# Patient Record
Sex: Male | Born: 1985 | Race: Black or African American | Hispanic: No | Marital: Married | Smoking: Current every day smoker
Health system: Southern US, Community
[De-identification: ages and names within clinical notes are randomized; demographics above are authoritative.]

## PROBLEM LIST (undated history)

## (undated) ENCOUNTER — Ambulatory Visit: Source: Home / Self Care

## (undated) DIAGNOSIS — J45909 Unspecified asthma, uncomplicated: Secondary | ICD-10-CM

## (undated) DIAGNOSIS — K649 Unspecified hemorrhoids: Secondary | ICD-10-CM

## (undated) DIAGNOSIS — K625 Hemorrhage of anus and rectum: Secondary | ICD-10-CM

## (undated) HISTORY — DX: Hemorrhage of anus and rectum: K62.5

## (undated) HISTORY — DX: Unspecified hemorrhoids: K64.9

---

## 2010-10-28 ENCOUNTER — Inpatient Hospital Stay (INDEPENDENT_AMBULATORY_CARE_PROVIDER_SITE_OTHER)
Admission: RE | Admit: 2010-10-28 | Discharge: 2010-10-28 | Disposition: A | Discharge status: Home / Self Care | Payer: Self-pay | Source: Ambulatory Visit | Attending: Emergency Medicine | Admitting: Emergency Medicine

## 2010-10-28 DIAGNOSIS — N342 Other urethritis: Secondary | ICD-10-CM

## 2010-10-30 LAB — GC/CHLAMYDIA PROBE AMP, GENITAL: Chlamydia, DNA Probe: POSITIVE — AB

## 2011-10-01 ENCOUNTER — Ambulatory Visit: Payer: Self-pay | Admitting: Family Medicine

## 2011-10-01 DIAGNOSIS — S61209A Unspecified open wound of unspecified finger without damage to nail, initial encounter: Secondary | ICD-10-CM

## 2011-10-01 DIAGNOSIS — Z23 Encounter for immunization: Secondary | ICD-10-CM

## 2011-10-01 DIAGNOSIS — S6000XA Contusion of unspecified finger without damage to nail, initial encounter: Secondary | ICD-10-CM

## 2011-10-01 DIAGNOSIS — IMO0001 Reserved for inherently not codable concepts without codable children: Secondary | ICD-10-CM

## 2011-10-01 MED ORDER — TETANUS-DIPHTH-ACELL PERTUSSIS 5-2.5-18.5 LF-MCG/0.5 IM SUSP
0.5000 mL | Freq: Once | INTRAMUSCULAR | Status: AC
  Administered 2011-10-01: 0.5 mL via INTRAMUSCULAR

## 2011-10-01 NOTE — Patient Instructions (Addendum)

## 2011-10-01 NOTE — Progress Notes (Signed)
Subjective: 26 year old [place] male who cut his right index finger along more this morning. Apparently somehow the blade flex his finger tip. He got a laceration going through the age of the nail and head of the finger. There is pain. He also has a small subungual hematoma developing under the middle finger nail  He is uncertain when he had his last tetanus shot. They're trying to check on that. He has been in the [place] 2 years. He is a Consulting civil engineer at [employer]. He is to take some exams next week and concerned that he will be able to keyboard without finger.  Although this occurred at work apparently it has not been put through his workers compensation.  Objective: Wound of finger tip as described above. The medial aspect of the nail is slice through longitudinally.   There is a mild subungual hematoma bruising developing under the middle finger nail. Her vascular is intact.  Assessment: Wound right index fingertip Subungual hematoma middle finger  Plan: Anesthetize and repair Checking on need for tetanus shot  It was decided that he did need the tetanus, so he was given a to get booster. Wound was repaired by Francia Greaves PA, and he will return in 7-10 days.

## 2011-10-01 NOTE — Progress Notes (Signed)
Verbal consent obtained from the patient.  Local anesthesia with 4cc Lidocaine 2% without epinephrine.  Wound scrubbed with soap and water and rinsed.  Wound closed with 4 4-0 Prolene simple interrupted sutures.  Wound cleansed and dressed.

## 2011-10-10 ENCOUNTER — Ambulatory Visit (INDEPENDENT_AMBULATORY_CARE_PROVIDER_SITE_OTHER): Payer: Self-pay | Admitting: Family Medicine

## 2011-10-10 ENCOUNTER — Ambulatory Visit: Payer: Self-pay

## 2011-10-10 VITALS — BP 128/76 | HR 64 | Temp 98.6°F | Resp 14 | Ht 70.0 in | Wt 201.0 lb

## 2011-10-10 DIAGNOSIS — S61219A Laceration without foreign body of unspecified finger without damage to nail, initial encounter: Secondary | ICD-10-CM

## 2011-10-10 DIAGNOSIS — S62609A Fracture of unspecified phalanx of unspecified finger, initial encounter for closed fracture: Secondary | ICD-10-CM

## 2011-10-10 DIAGNOSIS — S61209A Unspecified open wound of unspecified finger without damage to nail, initial encounter: Secondary | ICD-10-CM

## 2011-10-10 NOTE — Progress Notes (Signed)
26 year old [place] male who cut his right index finger 1 week ago. Apparently somehow the blade flex his finger tip. He got a laceration going through the age of the nail and head of the finger. There is minimal pain now. 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 Although this occurred at work apparently it has not been put through his workers compensation.  O:  The index finger, distal phalanx, is still quite swollen.  The wound edges are well approximated. The swelling gives the appearance of incomplete extension. There is only mild tenderness with palpation of the distal phalanx [facility] reading (PRIMARY) by  Dr. Milus Glazier:  Right distal phalanx of index finger fracture with STS  Assessment: No sign of infection, good wound healing, distal phalanx finger fracture  Plan: Remove sutures today, apply splint and maintained in a splinted position until recheck in one month  Return if increased swelling or pain occurs.

## 2017-08-08 ENCOUNTER — Emergency Department (HOSPITAL_COMMUNITY)
Admission: EM | Admit: 2017-08-08 | Discharge: 2017-08-08 | Disposition: A | Discharge status: Home / Self Care | Payer: Self-pay | Attending: Emergency Medicine | Admitting: Emergency Medicine

## 2017-08-08 ENCOUNTER — Emergency Department (HOSPITAL_COMMUNITY): Payer: Self-pay

## 2017-08-08 ENCOUNTER — Other Ambulatory Visit: Payer: Self-pay

## 2017-08-08 ENCOUNTER — Encounter (HOSPITAL_COMMUNITY): Payer: Self-pay

## 2017-08-08 DIAGNOSIS — F17210 Nicotine dependence, cigarettes, uncomplicated: Secondary | ICD-10-CM | POA: Insufficient documentation

## 2017-08-08 DIAGNOSIS — J069 Acute upper respiratory infection, unspecified: Secondary | ICD-10-CM | POA: Insufficient documentation

## 2017-08-08 DIAGNOSIS — J45901 Unspecified asthma with (acute) exacerbation: Secondary | ICD-10-CM | POA: Insufficient documentation

## 2017-08-08 HISTORY — DX: Unspecified asthma, uncomplicated: J45.909

## 2017-08-08 MED ORDER — BENZONATATE 100 MG PO CAPS: 100.0000 mg | ORAL_CAPSULE | Freq: Three times a day (TID) | ORAL | 0 refills | Status: DC

## 2017-08-08 MED ORDER — ALBUTEROL SULFATE (2.5 MG/3ML) 0.083% IN NEBU
5.0000 mg | INHALATION_SOLUTION | Freq: Once | RESPIRATORY_TRACT | Status: AC
  Administered 2017-08-08: 5 mg via RESPIRATORY_TRACT
  Filled 2017-08-08: qty 6

## 2017-08-08 MED ORDER — ALBUTEROL SULFATE HFA 108 (90 BASE) MCG/ACT IN AERS
1.0000 | INHALATION_SPRAY | Freq: Once | RESPIRATORY_TRACT | Status: AC
  Administered 2017-08-08: 2 via RESPIRATORY_TRACT
  Filled 2017-08-08: qty 6.7

## 2017-08-08 MED ORDER — ALBUTEROL SULFATE HFA 108 (90 BASE) MCG/ACT IN AERS: 1.0000 | INHALATION_SPRAY | Freq: Four times a day (QID) | RESPIRATORY_TRACT | 0 refills | Status: DC | PRN

## 2017-08-08 NOTE — ED Triage Notes (Addendum)
Pt reports allergies and chest pain with coughing. Pt states hx of asthma. No distress noted in triage. Skin warm and dry. Pt states the chest pain is only present when he coughs.

## 2017-08-08 NOTE — Discharge Instructions (Addendum)
Please read attached information. If you experience any new or worsening signs or symptoms please return to the emergency room for evaluation. Please follow-up with your primary care provider or specialist as discussed. Please use medication prescribed only as directed and discontinue taking if you have any concerning signs or symptoms.   °

## 2017-08-08 NOTE — ED Provider Notes (Signed)
[facility] EMERGENCY DEPARTMENT Provider Note   CSN: [id number] Arrival date & time: 08/08/17  1345     History   Chief Complaint Chief Complaint  Patient presents with  . Cough    HPI Stuart Green is a 32 y.o. male.  HPI   32 year old male presents today with complaints of asthma exacerbation.  Patient reports that last night he developed sneezing, cough, wheezing, rhinorrhea.  Patient notes a history of asthma but does not have inhalers at home.  Patient denies any fever, reports chest pain with coughing, none at rest.  Patient received breathing treatment prior to my evaluation which significantly improved his symptoms.  No shortness of breath at the time of evaluation.  Past Medical History:  Diagnosis Date  . Asthma     There are no active problems to display for this patient.   History reviewed. No pertinent surgical history.      Home Medications    Prior to Admission medications   Medication Sig Start Date End Date Taking? Authorizing Provider  albuterol (PROVENTIL HFA;VENTOLIN HFA) 108 (90 Base) MCG/ACT inhaler Inhale 1-2 puffs into the lungs every 6 (six) hours as needed for wheezing or shortness of breath. 08/08/17   Dawit Tankard, Tinnie Gens, PA-C  benzonatate (TESSALON) 100 MG capsule Take 1 capsule (100 mg total) by mouth every 8 (eight) hours. 08/08/17   Eyvonne Mechanic, PA-C    Family History History reviewed. No pertinent family history.  Social History Social History   Tobacco Use  . Smoking status: Current Every Day Smoker    Packs/day: 0.40    Types: Cigarettes  . Smokeless tobacco: Never Used  Substance Use Topics  . Alcohol use: Yes  . Drug use: Not on file     Allergies   Patient has no known allergies.   Review of Systems Review of Systems  All other systems reviewed and are negative.  Physical Exam Updated Vital Signs BP (!) 153/99 (BP Location: Right Arm)   Pulse 97   Temp 99 F (37.2 C) (Oral)   Resp 16    SpO2 99%   Physical Exam  Constitutional: He is oriented to person, place, and time. He appears well-developed and well-nourished.  HENT:  Head: Normocephalic and atraumatic.  Eyes: Pupils are equal, round, and reactive to light. Conjunctivae are normal. Right eye exhibits no discharge. Left eye exhibits no discharge. No scleral icterus.  Neck: Normal range of motion. No JVD present. No tracheal deviation present.  Cardiovascular: Normal rate, regular rhythm, normal heart sounds and intact distal pulses.  Pulmonary/Chest: Effort normal. No stridor. No respiratory distress. He has wheezes. He has no rales. He exhibits no tenderness.  Faint bilateral expiratory wheeze, no crackles no respiratory distress  Neurological: He is alert and oriented to person, place, and time. Coordination normal.  Psychiatric: He has a normal mood and affect. His behavior is normal. Judgment and thought content normal.  Nursing note and vitals reviewed.   ED Treatments / Results  Labs (all labs ordered are listed, but only abnormal results are displayed) Labs Reviewed - No data to display  EKG None  Radiology Dg Chest 2 View  Result Date: 08/08/2017 CLINICAL DATA:  Shortness of breath EXAM: CHEST - 2 VIEW COMPARISON:  None. FINDINGS: Heart and mediastinal contours are within normal limits. No focal opacities or effusions. No acute bony abnormality. IMPRESSION: No active cardiopulmonary disease. Electronically Signed   By: Charlett Nose M.D.   On: 08/08/2017 14:43  Procedures Procedures (including critical care time)  Medications Ordered in ED Medications  albuterol (PROVENTIL HFA;VENTOLIN HFA) 108 (90 Base) MCG/ACT inhaler 1-2 puff (has no administration in time range)  albuterol (PROVENTIL) (2.5 MG/3ML) 0.083% nebulizer solution 5 mg (5 mg Nebulization Given 08/08/17 1407)     Initial Impression / Assessment and Plan / ED Course  I have reviewed the triage vital signs and the nursing  notes.  Pertinent labs & imaging results that were available during my care of the patient were reviewed by me and considered in my medical decision making (see chart for details).       Final Clinical Impressions(s) / ED Diagnoses   Final diagnoses:  Viral upper respiratory tract infection  Mild asthma with exacerbation, unspecified whether persistent   Labs:   Imaging: Chest 2 view  Consults:  Therapeutics: Albuterol  Discharge Meds:   Assessment/Plan: Patient's presentation was consistent with viral URI with asthma exacerbation.  He is well-appearing in no acute distress at time of evaluation.  Patient has negative chest x-ray very minor wheeze.  He will be discharged home with albuterol, cough medication, strict return precautions.  Patient verbalized understanding and agreement to today's plan had no further questions or concerns at the time of discharge.   ED Discharge Orders        Ordered    albuterol (PROVENTIL HFA;VENTOLIN HFA) 108 (90 Base) MCG/ACT inhaler  Every 6 hours PRN     08/08/17 1708    benzonatate (TESSALON) 100 MG capsule  Every 8 hours     08/08/17 1708       Eyvonne Mechanic, PA-C 08/08/17 1709    Eber Hong, MD 08/12/17 [phone]

## 2019-04-03 IMAGING — DX DG CHEST 2V
2 series · 2 of 2 positions shown · non-contrast
Comparison: None.

CLINICAL DATA: Shortness of breath

EXAM:
CHEST - 2 VIEW

[w chest pa]
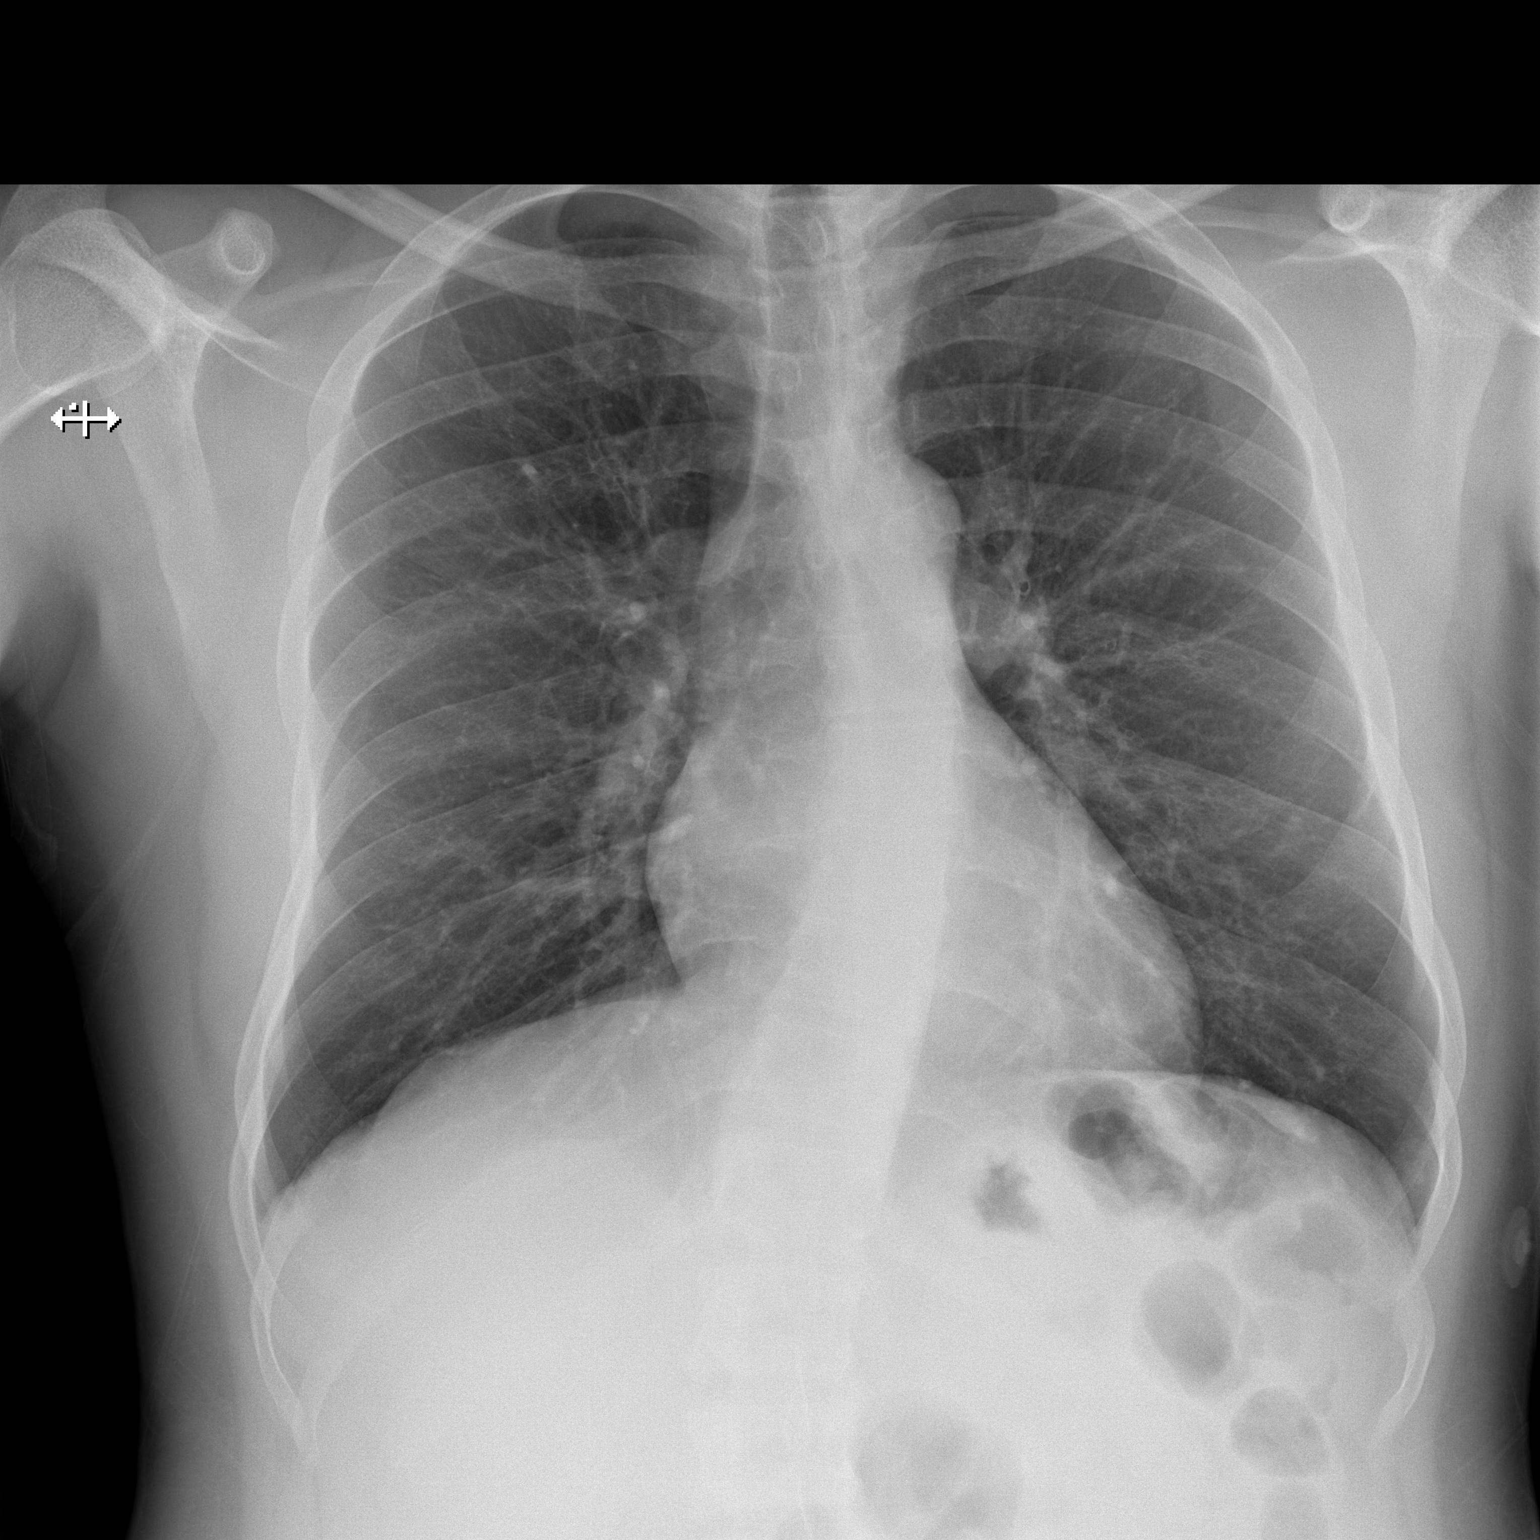

[w chest lat]
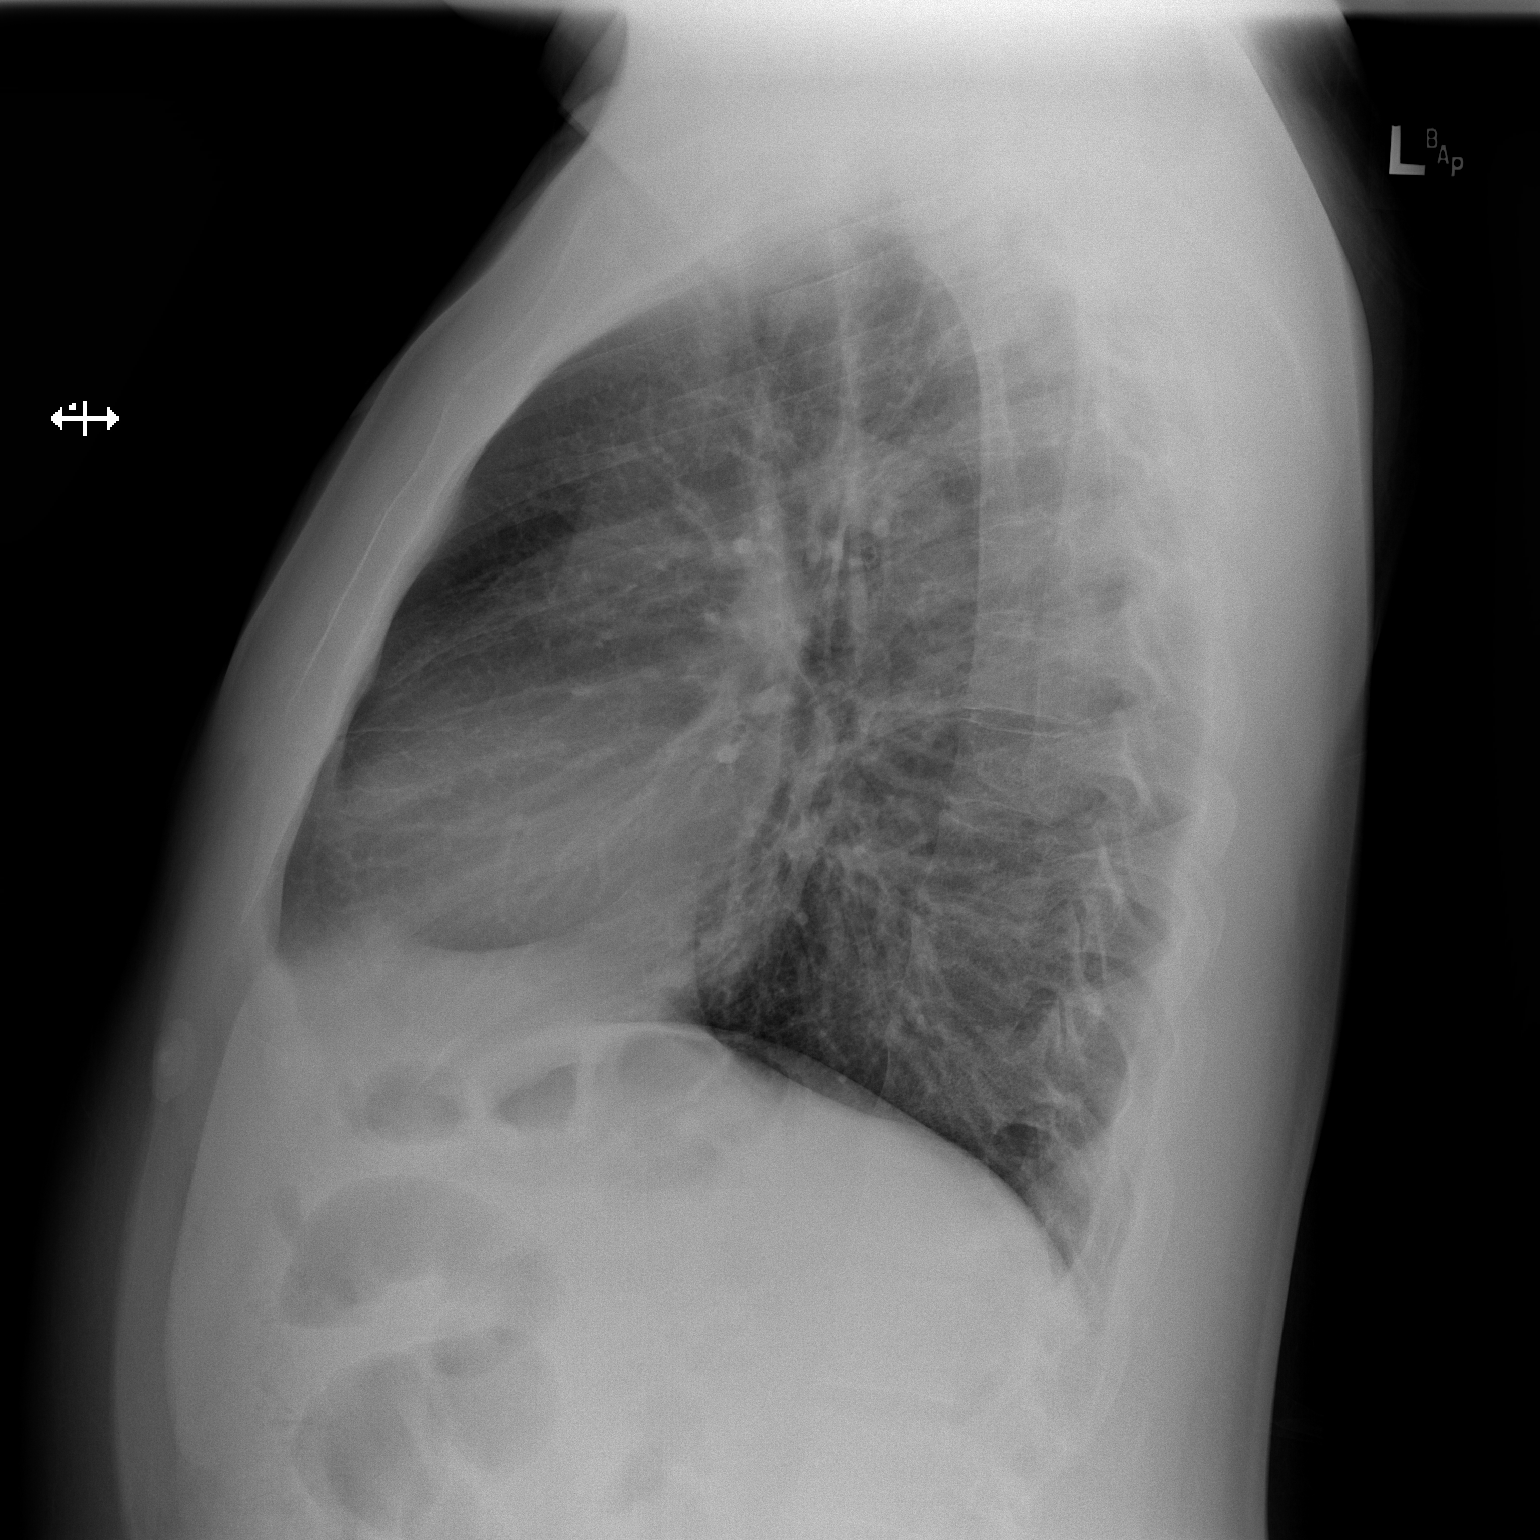

[2 of 2 positions shown; findings below may reference images not displayed]

FINDINGS: Heart and mediastinal contours are within normal limits. No focal
opacities or effusions. No acute bony abnormality.
IMPRESSION: No active cardiopulmonary disease.

## 2019-09-14 ENCOUNTER — Encounter (HOSPITAL_COMMUNITY): Payer: Self-pay | Admitting: Emergency Medicine

## 2019-09-14 ENCOUNTER — Emergency Department (HOSPITAL_COMMUNITY)
Admission: EM | Admit: 2019-09-14 | Discharge: 2019-09-14 | Disposition: A | Discharge status: Home / Self Care | Payer: 59 | Attending: Emergency Medicine | Admitting: Emergency Medicine

## 2019-09-14 ENCOUNTER — Other Ambulatory Visit: Payer: Self-pay

## 2019-09-14 DIAGNOSIS — J45909 Unspecified asthma, uncomplicated: Secondary | ICD-10-CM | POA: Insufficient documentation

## 2019-09-14 DIAGNOSIS — F17210 Nicotine dependence, cigarettes, uncomplicated: Secondary | ICD-10-CM | POA: Diagnosis not present

## 2019-09-14 DIAGNOSIS — K649 Unspecified hemorrhoids: Secondary | ICD-10-CM | POA: Diagnosis not present

## 2019-09-14 DIAGNOSIS — K625 Hemorrhage of anus and rectum: Secondary | ICD-10-CM | POA: Diagnosis not present

## 2019-09-14 LAB — COMPREHENSIVE METABOLIC PANEL
ALT: 49 U/L — ABNORMAL HIGH (ref 0–44)
AST: 32 U/L (ref 15–41)
Albumin: 4 g/dL (ref 3.5–5.0)
Alkaline Phosphatase: 58 U/L (ref 38–126)
Anion gap: 10 (ref 5–15)
BUN: 7 mg/dL (ref 6–20)
CO2: 23 mmol/L (ref 22–32)
Calcium: 9.1 mg/dL (ref 8.9–10.3)
Chloride: 103 mmol/L (ref 98–111)
Creatinine, Ser: 0.83 mg/dL (ref 0.61–1.24)
GFR calc Af Amer: >60 mL/min (ref >60)
GFR calc non Af Amer: >60 mL/min (ref >60)
Glucose, Bld: 101 mg/dL — ABNORMAL HIGH (ref 70–99)
Potassium: 4 mmol/L (ref 3.5–5.1)
Sodium: 136 mmol/L (ref 135–145)
Total Bilirubin: 0.7 mg/dL (ref 0.3–1.2)
Total Protein: 7.2 g/dL (ref 6.5–8.1)

## 2019-09-14 LAB — CBC
HCT: 45.6 % (ref 39.0–52.0)
Hemoglobin: 14.8 g/dL (ref 13.0–17.0)
MCH: 28.1 pg (ref 26.0–34.0)
MCHC: 32.5 g/dL (ref 30.0–36.0)
MCV: 86.7 fL (ref 80.0–100.0)
Platelets: 211 10*3/uL (ref 150–400)
RBC: 5.26 MIL/uL (ref 4.22–5.81)
RDW: 14.1 % (ref 11.5–15.5)
WBC: 4.7 10*3/uL (ref 4.0–10.5)
nRBC: 0 % (ref 0.0–0.2)

## 2019-09-14 LAB — POC OCCULT BLOOD, ED: Fecal Occult Bld: NEGATIVE

## 2019-09-14 LAB — TYPE AND SCREEN
ABO/RH(D): O POS
Antibody Screen: NEGATIVE

## 2019-09-14 LAB — ABO/RH: ABO/RH(D): O POS

## 2019-09-14 NOTE — ED Provider Notes (Signed)
[facility] EMERGENCY DEPARTMENT Provider Note   CSN: [id number] Arrival date & time: 09/14/19  1008     History Chief Complaint  Patient presents with   Rectal Bleeding    Stuart Green is a 34 y.o. male.  HPI Patient presents with blood in stool.  His common thought of the last 2 to 3 months.  States it will be a little bit of blood mixed in with the stool some blood when he wipes.  No pain.  States he has had a hemorrhoid however for 4 to 5 months.  No unexplained weight loss.  No weight gain.  No change in the color of the stool.  States he has had issues mildly with diarrhea for more of his life but really not had much bleeding in the past.  No trauma.  Has not seen a gastroenterologist.    Past Medical History:  Diagnosis Date   Asthma     There are no problems to display for this patient.   History reviewed. No pertinent surgical history.     No family history on file.  Social History   Tobacco Use   Smoking status: Current Every Day Smoker    Packs/day: 0.40    Types: Cigarettes   Smokeless tobacco: Never Used  Substance Use Topics   Alcohol use: Yes   Drug use: Not Currently    Home Medications Prior to Admission medications   Medication Sig Start Date End Date Taking? Authorizing Provider  albuterol (PROVENTIL HFA;VENTOLIN HFA) 108 (90 Base) MCG/ACT inhaler Inhale 1-2 puffs into the lungs every 6 (six) hours as needed for wheezing or shortness of breath. 08/08/17   Hedges, Dellis Filbert, PA-C  benzonatate (TESSALON) 100 MG capsule Take 1 capsule (100 mg total) by mouth every 8 (eight) hours. 08/08/17   Okey Regal, PA-C    Allergies    Patient has no known allergies.  Review of Systems   Review of Systems  Constitutional: Negative for appetite change.  HENT: Negative for congestion.   Respiratory: Negative for shortness of breath.   Cardiovascular: Negative for chest pain.  Gastrointestinal: Positive for anal bleeding and  blood in stool. Negative for abdominal pain, diarrhea, nausea and rectal pain.  Genitourinary: Negative for flank pain.  Musculoskeletal: Negative for back pain.  Skin: Negative for rash.  Neurological: Negative for weakness.    Physical Exam Updated Vital Signs BP (!) 156/108 (BP Location: Right Arm)    Pulse 72    Temp 99.1 F (37.3 C) (Oral)    Resp 18    SpO2 100%   Physical Exam Vitals and nursing note reviewed.  HENT:     Head: Atraumatic.  Eyes:     Pupils: Pupils are equal, round, and reactive to light.  Cardiovascular:     Rate and Rhythm: Regular rhythm.  Pulmonary:     Breath sounds: No wheezing or rhonchi.  Abdominal:     Tenderness: There is no abdominal tenderness.  Genitourinary:    Comments: Guaiac negative.  Does have external hemorrhoid but no clot or active bleeding seen.  Minimal amount of stool on exam but did appear to be brown.  No rectal tenderness. Musculoskeletal:     Cervical back: Neck supple.  Skin:    General: Skin is warm.     Capillary Refill: Capillary refill takes less than 2 seconds.  Neurological:     Mental Status: He is alert and oriented to person, place, and time.  ED Results / Procedures / Treatments   Labs (all labs ordered are listed, but only abnormal results are displayed) Labs Reviewed  COMPREHENSIVE METABOLIC PANEL - Abnormal; Notable for the following components:      Result Value   Glucose, Bld 101 (*)    ALT 49 (*)    All other components within normal limits  CBC  POC OCCULT BLOOD, ED  TYPE AND SCREEN  ABO/RH    EKG None  Radiology No results found.  Procedures Procedures (including critical care time)  Medications Ordered in ED Medications - No data to display  ED Course  I have reviewed the triage vital signs and the nursing notes.  Pertinent labs & imaging results that were available during my care of the patient were reviewed by me and considered in my medical decision making (see chart for  details).    MDM Rules/Calculators/A&P                          Patient with rectal bleeding with benign exam and benign blood work.  Does have hemorrhoids without active bleeding seen.  Will have follow-up with gastroenterology.  Also some hypertension here.  Unknown baseline but will follow up as an outpatient also for this. Final Clinical Impression(s) / ED Diagnoses Final diagnoses:  Rectal bleeding  Hemorrhoids, unspecified hemorrhoid type    Rx / DC Orders ED Discharge Orders    None       Benjiman Core, MD 09/14/19 1220

## 2019-09-14 NOTE — ED Triage Notes (Signed)
Reports intermittent blood in stool for a few months.  States the last episode was 2 days ago. Denies abd pain.

## 2021-07-14 DIAGNOSIS — K645 Perianal venous thrombosis: Secondary | ICD-10-CM | POA: Insufficient documentation

## 2022-02-17 ENCOUNTER — Ambulatory Visit: Payer: 59 | Admitting: Family Medicine

## 2022-04-07 ENCOUNTER — Ambulatory Visit
Admission: EM | Admit: 2022-04-07 | Discharge: 2022-04-07 | Disposition: A | Discharge status: Home / Self Care | Payer: 59 | Attending: Internal Medicine | Admitting: Internal Medicine

## 2022-04-07 DIAGNOSIS — R21 Rash and other nonspecific skin eruption: Secondary | ICD-10-CM

## 2022-04-07 DIAGNOSIS — T7840XA Allergy, unspecified, initial encounter: Secondary | ICD-10-CM

## 2022-04-07 DIAGNOSIS — R030 Elevated blood-pressure reading, without diagnosis of hypertension: Secondary | ICD-10-CM | POA: Diagnosis not present

## 2022-04-07 MED ORDER — PREDNISONE 20 MG PO TABS: 40.0000 mg | ORAL_TABLET | Freq: Every day | ORAL | 0 refills | Status: AC

## 2022-04-07 NOTE — Discharge Instructions (Signed)
I have prescribed prednisone steroid to help alleviate rash to your face.  Please avoid barbershop if possible.  I recommend that you get a blood pressure cuff from the pharmacy as well to monitor your blood pressure at home.  If blood pressure is consistently above 140, please see family doctor.

## 2022-04-07 NOTE — ED Provider Notes (Signed)
[facility]    CSN: [id number] Arrival date & time: 04/07/22  1125      History   Chief Complaint Chief Complaint  Patient presents with   Allergic Reaction    HPI Stuart Green is a 37 y.o. male.   Patient presents with itchy rash throughout face that has been present for approximately 1 week.  Patient reports that this happens every time he goes to the barbershop.  He has attempted to try a different barbershop but it still occurs.  Denies any other changes to the environment.  Denies any associated fever.  Patient has not used any medications to help alleviate symptoms.  Denies feelings of throat closing or shortness of breath.  Patient also has mildly elevated blood pressure reading but denies history of hypertension and does not take any daily medications.  Patient does not report chest pain, shortness of breath, headache, dizziness, blurred vision, nausea, vomiting.   Allergic Reaction   Past Medical History:  Diagnosis Date   Asthma     There are no problems to display for this patient.   History reviewed. No pertinent surgical history.     Home Medications    Prior to Admission medications   Medication Sig Start Date End Date Taking? Authorizing Provider  predniSONE (DELTASONE) 20 MG tablet Take 2 tablets (40 mg total) by mouth daily for 5 days. 04/07/22 04/12/22 Yes Rebecka Oelkers, Michele Rockers, FNP  albuterol (PROVENTIL HFA;VENTOLIN HFA) 108 (90 Base) MCG/ACT inhaler Inhale 1-2 puffs into the lungs every 6 (six) hours as needed for wheezing or shortness of breath. 08/08/17   Hedges, Dellis Filbert, PA-C  benzonatate (TESSALON) 100 MG capsule Take 1 capsule (100 mg total) by mouth every 8 (eight) hours. 08/08/17   Okey Regal, PA-C    Family History History reviewed. No pertinent family history.  Social History Social History   Tobacco Use   Smoking status: Every Day    Packs/day: 0.40    Types: Cigarettes   Smokeless tobacco: Never  Substance Use Topics    Alcohol use: Yes   Drug use: Not Currently     Allergies   Patient has no known allergies.   Review of Systems Review of Systems Per HPI  Physical Exam Triage Vital Signs ED Triage Vitals  Enc Vitals Group     BP 04/07/22 1139 (!) 140/92     Pulse Rate 04/07/22 1139 89     Resp 04/07/22 1139 12     Temp 04/07/22 1139 98.2 F (36.8 C)     Temp Source 04/07/22 1139 Oral     SpO2 04/07/22 1139 98 %     Weight --      Height --      Head Circumference --      Peak Flow --      Pain Score 04/07/22 1138 0     Pain Loc --      Pain Edu? --      Excl. in [place]? --    No data found.  Updated Vital Signs BP (!) 140/92 (BP Location: Left Arm)   Pulse 89   Temp 98.2 F (36.8 C) (Oral)   Resp 12   SpO2 98%   Visual Acuity Right Eye Distance:   Left Eye Distance:   Bilateral Distance:    Right Eye Near:   Left Eye Near:    Bilateral Near:     Physical Exam Constitutional:      General: He is not in acute  distress.    Appearance: Normal appearance. He is not toxic-appearing or diaphoretic.  HENT:     Head: Normocephalic and atraumatic.     Mouth/Throat:     Pharynx: No pharyngeal swelling.     Comments: Tongue and throat normal.  Eyes:     Extraocular Movements: Extraocular movements intact.     Conjunctiva/sclera: Conjunctivae normal.     Pupils: Pupils are equal, round, and reactive to light.  Cardiovascular:     Rate and Rhythm: Normal rate and regular rhythm.     Pulses: Normal pulses.     Heart sounds: Normal heart sounds.  Pulmonary:     Effort: Pulmonary effort is normal. No respiratory distress.     Breath sounds: Normal breath sounds. No stridor. No wheezing or rhonchi.  Skin:    Comments: Patient has maculopapular rash present throughout entirety of face.  Neurological:     General: No focal deficit present.     Mental Status: He is alert and oriented to person, place, and time. Mental status is at baseline.     Cranial Nerves: Cranial nerves 2-12  are intact.     Sensory: Sensation is intact.     Motor: Motor function is intact.     Coordination: Coordination is intact.     Gait: Gait is intact.  Psychiatric:        Mood and Affect: Mood normal.        Behavior: Behavior normal.        Thought Content: Thought content normal.        Judgment: Judgment normal.      UC Treatments / Results  Labs (all labs ordered are listed, but only abnormal results are displayed) Labs Reviewed - No data to display  EKG   Radiology No results found.  Procedures Procedures (including critical care time)  Medications Ordered in UC Medications - No data to display  Initial Impression / Assessment and Plan / UC Course  I have reviewed the triage vital signs and the nursing notes.  Pertinent labs & imaging results that were available during my care of the patient were reviewed by me and considered in my medical decision making (see chart for details).     Patient has itchy rash that is consistent with allergic reaction present to face.  There are no signs of anaphylaxis on exam so do not think that epinephrine administration or emergent evaluation is necessary.  Will treat with oral prednisone steroid burst.  Patient was advised to follow-up if any symptoms persist or worsen.  Also advised patient to avoid trigger.  Patient verbalized understanding and was agreeable with plan.  Patient also has mildly elevated blood pressure reading with recheck being improved.  It was originally 160 systolic and improved to 140 systolic.  Patient is asymptomatic regarding blood pressure and neuroexam is normal so do not think that emergent evaluation is necessary at this time.  Patient advised to monitor blood pressure at home and follow-up with family medicine or urgent care if it remains elevated.  Patient verbalized understanding and was agreeable with plan. Final Clinical Impressions(s) / UC Diagnoses   Final diagnoses:  Facial rash  Allergic  reaction, initial encounter  Elevated blood pressure reading     Discharge Instructions      I have prescribed prednisone steroid to help alleviate rash to your face.  Please avoid barbershop if possible.  I recommend that you get a blood pressure cuff from the pharmacy as well to  monitor your blood pressure at home.  If blood pressure is consistently above 140, please see family doctor.    ED Prescriptions     Medication Sig Dispense Auth. Provider   predniSONE (DELTASONE) 20 MG tablet Take 2 tablets (40 mg total) by mouth daily for 5 days. 10 tablet Teodora Medici, [place]      PDMP not reviewed this encounter.   Teodora Medici, [place] 04/07/22 1202

## 2022-04-07 NOTE — ED Triage Notes (Signed)
Pt is here for allergic reaction , pt has rash on face causing some itching x 1wk

## 2023-03-28 ENCOUNTER — Encounter: Payer: Self-pay | Admitting: General Practice

## 2023-03-29 ENCOUNTER — Ambulatory Visit: Payer: 59 | Admitting: Family Medicine

## 2023-06-08 ENCOUNTER — Ambulatory Visit: Payer: 59 | Admitting: Family Medicine

## 2023-08-21 ENCOUNTER — Encounter: Payer: Self-pay | Admitting: General Practice

## 2023-09-01 ENCOUNTER — Encounter: Payer: Self-pay | Admitting: Family

## 2023-09-01 ENCOUNTER — Ambulatory Visit (INDEPENDENT_AMBULATORY_CARE_PROVIDER_SITE_OTHER): Admitting: Family

## 2023-09-01 VITALS — BP 127/84 | HR 91 | Temp 99.0°F | Resp 16 | Ht 69.0 in | Wt 224.8 lb

## 2023-09-01 DIAGNOSIS — M79602 Pain in left arm: Secondary | ICD-10-CM | POA: Diagnosis not present

## 2023-09-01 DIAGNOSIS — Z7689 Persons encountering health services in other specified circumstances: Secondary | ICD-10-CM

## 2023-09-01 MED ORDER — MELOXICAM 7.5 MG PO TABS: 7.5000 mg | ORAL_TABLET | Freq: Every day | ORAL | 0 refills | Status: DC

## 2023-09-01 NOTE — Progress Notes (Signed)
 Pain in left arm,

## 2023-09-01 NOTE — Progress Notes (Signed)
 Subjective:    Stuart Green - 38 y.o. male MRN [id number]  Date of birth: September 11, 1985  HPI  Stuart Green is to establish care.  Current issues and/or concerns: - Intermittent left upper arm pain radiating to left shoulder. Denies recent trauma/injury and red flag symptoms. States notices sometimes when he is holding his daughter.  - No further issues/concerns for discussion today.   ROS per HPI    Health Maintenance:  Health Maintenance Due  Topic Date Due   HIV Screening  Never done   Hepatitis C Screening  Never done     Past Medical History: Patient Active Problem List   Diagnosis Date Noted   Thrombosed external hemorrhoid 07/14/2021      Social History   reports that he has been smoking cigarettes. He has never used smokeless tobacco. He reports current alcohol use. He reports that he does not currently use drugs.   Family History  family history is not on file.   Medications: reviewed and updated   Objective:   Physical Exam BP 127/84   Pulse 91   Temp 99 F (37.2 C) (Oral)   Resp 16   Ht 5' 9 (1.753 m)   Wt 224 lb 12.8 oz (102 kg)   SpO2 95%   BMI 33.20 kg/m   Physical Exam HENT:     Head: Normocephalic and atraumatic.     Nose: Nose normal.     Mouth/Throat:     Mouth: Mucous membranes are moist.     Pharynx: Oropharynx is clear.   Eyes:     Extraocular Movements: Extraocular movements intact.     Conjunctiva/sclera: Conjunctivae normal.     Pupils: Pupils are equal, round, and reactive to light.    Cardiovascular:     Rate and Rhythm: Normal rate and regular rhythm.     Pulses: Normal pulses.     Heart sounds: Normal heart sounds.  Pulmonary:     Effort: Pulmonary effort is normal.     Breath sounds: Normal breath sounds.   Musculoskeletal:        General: Normal range of motion.     Right shoulder: Normal.     Left shoulder: Normal.     Right upper arm: Normal.     Left upper arm: Normal.     Right elbow: Normal.     Left  elbow: Normal.     Right forearm: Normal.     Left forearm: Normal.     Right wrist: Normal.     Left wrist: Normal.     Right hand: Normal.     Left hand: Normal.     Cervical back: Normal range of motion and neck supple.   Neurological:     General: No focal deficit present.     Mental Status: He is alert and oriented to person, place, and time.   Psychiatric:        Mood and Affect: Mood normal.        Behavior: Behavior normal.        Assessment & Plan:  1. Encounter to establish care (Primary) - Patient presents today to establish care. During the interim follow-up with primary provider as scheduled.  - Return for annual physical examination, labs, and health maintenance. Arrive fasting meaning having no food for at least 8 hours prior to appointment. You may have only water or black coffee. Please take scheduled medications as normal.  2. Left arm pain - Meloxicam as prescribed. Counseled on  medication adherence/adverse effects.  - Follow-up with primary provider in 4 weeks or sooner if needed.  - meloxicam (MOBIC) 7.5 MG tablet; Take 1 tablet (7.5 mg total) by mouth daily.  Dispense: 90 tablet; Refill: 0   Patient was given clear instructions to go to Emergency Department or return to medical center if symptoms don't improve, worsen, or new problems develop.The patient verbalized understanding.  I discussed the assessment and treatment plan with the patient. The patient was provided an opportunity to ask questions and all were answered. The patient agreed with the plan and demonstrated an understanding of the instructions.   The patient was advised to call back or seek an in-person evaluation if the symptoms worsen or if the condition fails to improve as anticipated.    Lavona Pounds, NP 09/01/2023, 1:43 PM Primary Care at [facility]

## 2023-11-22 ENCOUNTER — Telehealth: Payer: Self-pay

## 2023-11-22 NOTE — Telephone Encounter (Signed)
 This message is an Financial planner.  TOC appointment with Dr. Bennett on 11/30/23.   Copied from CRM [id number]. Topic: Appointments - Transfer of Care >> Nov 22, 2023  9:22 AM Berneda FALCON wrote: Pt is requesting to transfer FROM: [place] Pt is requesting to transfer TO: [place] Reason for requested transfer: Pt did not list reason It is the responsibility of the team the patient would like to transfer to (Dr. Bennett) to reach out to the patient if for any reason this transfer is not acceptable.

## 2023-11-30 ENCOUNTER — Other Ambulatory Visit: Payer: Self-pay | Admitting: Family

## 2023-11-30 ENCOUNTER — Ambulatory Visit (INDEPENDENT_AMBULATORY_CARE_PROVIDER_SITE_OTHER)

## 2023-11-30 VITALS — BP 132/88 | HR 77 | Temp 98.0°F | Ht 69.0 in | Wt 219.4 lb

## 2023-11-30 DIAGNOSIS — K921 Melena: Secondary | ICD-10-CM

## 2023-11-30 DIAGNOSIS — Z136 Encounter for screening for cardiovascular disorders: Secondary | ICD-10-CM | POA: Diagnosis not present

## 2023-11-30 DIAGNOSIS — Z23 Encounter for immunization: Secondary | ICD-10-CM | POA: Diagnosis not present

## 2023-11-30 DIAGNOSIS — E669 Obesity, unspecified: Secondary | ICD-10-CM | POA: Insufficient documentation

## 2023-11-30 DIAGNOSIS — Z113 Encounter for screening for infections with a predominantly sexual mode of transmission: Secondary | ICD-10-CM | POA: Diagnosis not present

## 2023-11-30 DIAGNOSIS — M79602 Pain in left arm: Secondary | ICD-10-CM

## 2023-11-30 DIAGNOSIS — K644 Residual hemorrhoidal skin tags: Secondary | ICD-10-CM

## 2023-11-30 DIAGNOSIS — K645 Perianal venous thrombosis: Secondary | ICD-10-CM

## 2023-11-30 NOTE — Progress Notes (Signed)
 Subjective:    Patient ID: Stuart Green, male    DOB: [birth year], 38 y.o.   MRN: [id number]   Stuart Green is a very pleasant 38 y.o. male who presents today as a new patient.  Past medical, surgical and family history: Reviewed and updated in chart.  Allergies: Reviewed and updated in chart.  Medications: Reviewed and updated in chart.  Social history: Reviewed and updated in chart.  Last PCP and reason for leaving: [facility] at [facility], moved and wanted a closer clinic  Today, wants blood in stool and labs to be discussed.  Blood in stool x2yrs, large amounts, not with every BM but if have multiple in a day, happening about once every 1-2wks, dark red blood, stools soft, anal irritation sometimes makes him unable to sit down, feels a bump on anus, gets bigger after BM, Has Bms every other day Curry exacerbates sx, coffee,  Denies notable weight loss, no fam hx colon cancer, no abdominal pain, no fever, bodyaches, no rashes/skin changes.     Review of Systems  All other systems reviewed and are negative.        Past Medical History:  Diagnosis Date   Asthma    In childhood, but resolved    Social History   Socioeconomic History   Marital status: Married    Spouse name: Not on file   Number of children: Not on file   Years of education: Not on file   Highest education level: Not on file  Occupational History   Not on file  Tobacco Use   Smoking status: Every Day    Current packs/day: 0.40    Types: Cigarettes   Smokeless tobacco: Never  Vaping Use   Vaping status: Never Used  Substance and Sexual Activity   Alcohol use: Yes   Drug use: Not Currently   Sexual activity: Yes    Birth control/protection: Condom  Other Topics Concern   Not on file  Social History Narrative   Lives in a house with wife and 2 kids, 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  Currently sexually more than one females partners.     Social Drivers of Corporate investment banker Strain: Low Risk  (09/01/2023)   Overall Financial Resource Strain (CARDIA)    Difficulty of Paying Living Expenses: Not hard at all  Food Insecurity: No Food Insecurity (09/01/2023)   Hunger Vital Sign    Worried About Running Out of Food in the Last Year: Never true    Ran Out of Food in the Last Year: Never true  Transportation Needs: No Transportation Needs (09/01/2023)   PRAPARE - Administrator, Civil Service (Medical): No    Lack of Transportation (Non-Medical): No  Physical Activity: Inactive (09/01/2023)   Exercise Vital Sign    Days of Exercise per Week: 0 days    Minutes of Exercise per Session: 0 min  Stress: No Stress Concern Present (09/01/2023)   [employer] of Occupational Health - Occupational Stress Questionnaire    Feeling of Stress: Not at all  Social Connections: Moderately Integrated (09/01/2023)   Social Connection and Isolation Panel    Frequency of Communication with Friends and Family: Three times a week    Frequency of Social Gatherings with Friends and Family: Once a week    Attends Religious Services: 1 to 4 times per year    Active  Member of Clubs or Organizations: No    Attends Banker Meetings: Never    Marital Status: Married  Catering manager Violence: Not At Risk (09/01/2023)   Humiliation, Afraid, Rape, and Kick questionnaire    Fear of Current or Ex-Partner: No    Emotionally Abused: No    Physically Abused: No    Sexually Abused: No    History reviewed. No pertinent surgical history.  Family History  Problem Relation Age of Onset   Cancer Paternal Grandmother     Allergies  Allergen Reactions   Dust Mite Extract Other (See Comments)    sneezing    No current outpatient medications on file prior to visit.   No current facility-administered medications on file prior to visit.    BP 132/88   Pulse 77    Temp 98 F (36.7 C) (Oral)   Ht 5' 9 (1.753 m)   Wt 219 lb 6 oz (99.5 kg)   SpO2 98%   BMI 32.40 kg/m   Objective:    Physical Exam Vitals and nursing note reviewed.  Constitutional:      Appearance: He is obese.  HENT:     Head: Normocephalic and atraumatic.  Eyes:     Extraocular Movements: Extraocular movements intact.     Conjunctiva/sclera: Conjunctivae normal.  Genitourinary:    Rectum: Anal fissure and external hemorrhoid present. No tenderness.   Skin:    General: Skin is warm.  Neurological:     Mental Status: He is alert.  Psychiatric:        Mood and Affect: Mood normal.        Behavior: Behavior normal.            Assessment & Plan:   1. Blood in stool (Primary) 2. External hemorrhoid New patient, past medical history reviewed and updated accordingly.  Has been having large amounts of blood with bloody bowel movements for the last 8 years.  Has never had a colonoscopy, there is no family history of colon cancer, no outward signs to suggest inflammatory bowel disease, however, given the longevity of the symptoms and lack of prior workup per chart review, will still refer to GI to be considered for colonoscopy, malignancy at this age is rare but is a possibility.  A more likely cause of his multiple bowel movements and resulting in hemorrhoid could be IBS, counseled on low FODMAP diet and provided a printout, also likely lactose intolerant, counseled on nondairy alternatives.  Clinical exam does reveal anal fissure and a nonthrombosed external hemorrhoid, per patient's request will refer to colorectal surgeon for evaluation for hemorrhoid removal surgery.  - Ambulatory referral to Gastroenterology - CBC with Differential; Future - Ambulatory referral to Colorectal Surgery  3. Screening for cardiovascular condition 4. Screening examination for STD (sexually transmitted disease) 5. Need for influenza vaccination Patient is due for CPE, see age-appropriate  screening labs ordered below, plan for it in 2 weeks.  - Lipid Panel; Future - Hemoglobin A1c; Future - HIV antibody (with reflex); Future - Hepatitis C antibody; Future - Flu vaccine trivalent PF, 6mos and older(Flulaval,Afluria,Fluarix,Fluzone)   Return in about 2 weeks (around 12/14/2023) for CPE, with lab appointment 1wk prior.   Kileigh Ortmann K Dorsie Burich, MD  11/30/23

## 2023-11-30 NOTE — Patient Instructions (Signed)
 Thank you for visiting [facility] today! Here's what we talked about: - Avoid dairy, instead use: Almond milk, soy milk, lactaid -Avoid other foods that irritate bowels

## 2023-12-07 ENCOUNTER — Other Ambulatory Visit (INDEPENDENT_AMBULATORY_CARE_PROVIDER_SITE_OTHER)

## 2023-12-07 DIAGNOSIS — Z136 Encounter for screening for cardiovascular disorders: Secondary | ICD-10-CM

## 2023-12-07 DIAGNOSIS — K921 Melena: Secondary | ICD-10-CM

## 2023-12-07 DIAGNOSIS — Z113 Encounter for screening for infections with a predominantly sexual mode of transmission: Secondary | ICD-10-CM | POA: Diagnosis not present

## 2023-12-07 LAB — CBC WITH DIFFERENTIAL/PLATELET
Basophils Absolute: 0 K/uL (ref 0.0–0.1)
Basophils Relative: 0.6 % (ref 0.0–3.0)
Eosinophils Absolute: 0.2 K/uL (ref 0.0–0.7)
Eosinophils Relative: 3 % (ref 0.0–5.0)
HCT: 44.7 % (ref 39.0–52.0)
Hemoglobin: 14.5 g/dL (ref 13.0–17.0)
Lymphocytes Relative: 40.9 % (ref 12.0–46.0)
Lymphs Abs: 2.7 K/uL (ref 0.7–4.0)
MCHC: 32.3 g/dL (ref 30.0–36.0)
MCV: 86.7 fl (ref 78.0–100.0)
Monocytes Absolute: 0.8 K/uL (ref 0.1–1.0)
Monocytes Relative: 12.3 % — ABNORMAL HIGH (ref 3.0–12.0)
Neutro Abs: 2.8 K/uL (ref 1.4–7.7)
Neutrophils Relative %: 43.2 % (ref 43.0–77.0)
Platelets: 165 K/uL (ref 150.0–400.0)
RBC: 5.16 Mil/uL (ref 4.22–5.81)
RDW: 14.2 % (ref 11.5–15.5)
WBC: 6.5 K/uL (ref 4.0–10.5)

## 2023-12-07 LAB — LIPID PANEL
Cholesterol: 163 mg/dL (ref 0–200)
HDL: 37.9 mg/dL — ABNORMAL LOW (ref >39.00)
LDL Cholesterol: 111 mg/dL — ABNORMAL HIGH (ref 0–99)
NonHDL: 125.23
Total CHOL/HDL Ratio: 4
Triglycerides: 72 mg/dL (ref 0.0–149.0)
VLDL: 14.4 mg/dL (ref 0.0–40.0)

## 2023-12-07 LAB — HEMOGLOBIN A1C: Hgb A1c MFr Bld: 6.2 % (ref 4.6–6.5)

## 2023-12-08 ENCOUNTER — Ambulatory Visit: Payer: Self-pay

## 2023-12-08 LAB — HIV ANTIBODY (ROUTINE TESTING W REFLEX)
HIV 1&2 Ab, 4th Generation: NONREACTIVE
HIV FINAL INTERPRETATION: NEGATIVE

## 2023-12-08 LAB — HEPATITIS C ANTIBODY: Hepatitis C Ab: NONREACTIVE

## 2023-12-15 ENCOUNTER — Encounter

## 2023-12-21 ENCOUNTER — Ambulatory Visit: Admitting: Surgery

## 2023-12-29 ENCOUNTER — Ambulatory Visit

## 2023-12-29 VITALS — BP 114/78 | HR 70 | Temp 98.5°F | Ht 69.0 in | Wt 219.0 lb

## 2023-12-29 DIAGNOSIS — Z0000 Encounter for general adult medical examination without abnormal findings: Secondary | ICD-10-CM | POA: Diagnosis not present

## 2023-12-29 DIAGNOSIS — Z23 Encounter for immunization: Secondary | ICD-10-CM

## 2023-12-29 DIAGNOSIS — E669 Obesity, unspecified: Secondary | ICD-10-CM | POA: Diagnosis not present

## 2023-12-29 DIAGNOSIS — E785 Hyperlipidemia, unspecified: Secondary | ICD-10-CM | POA: Diagnosis not present

## 2023-12-29 DIAGNOSIS — Z113 Encounter for screening for infections with a predominantly sexual mode of transmission: Secondary | ICD-10-CM

## 2023-12-29 DIAGNOSIS — R7303 Prediabetes: Secondary | ICD-10-CM | POA: Diagnosis not present

## 2023-12-29 NOTE — Patient Instructions (Signed)
 Thank you for visiting [facility] today! Here's what we talked about: - Cut down on Sodas, increase intake of fruit and veg - Exercise for 3-5 times a week

## 2023-12-29 NOTE — Progress Notes (Signed)
   Assessment & Plan:   1. Physical exam, annual (Primary) 2. Obesity (BMI 30-39.9) 3. Prediabetes 4. Hyperlipidemia, unspecified hyperlipidemia type  5. Need for Td vaccine Age-appropriate screening, counseling and vaccines discussed with patient. Healthy diet and exercise recommendations given.  Discussed BMI of 32, he declines weight management referral at this time, opting instead to use diet and lifestyle to improve his weight.  Counseled that these measures would also improve cholesterol, not age-appropriate for statin at this time. Discussed elevated A1c, he is agreeable to cut down on sodas, plan to recheck in 3 months. Td vaccine given this visit, UTD on flu, patient declines COVID-vaccine.   5. Screen for STD (sexually transmitted disease) Patient has endorsed high risk sexual behavior with multiple male partners in the recent past, asymptomatic at this time, HIV and hepatitis C are negative, will rule out hepatitis B, as well as check his immunity.  - Hepatitis B surface antibody,qualitative - Hepatitis panel, acute    Follow-up: Return in about 3 months (around 03/30/2024) for Prediabetes .      Subjective:   Patient ID: Stuart Green, male    DOB: 1985-10-07  Age: 38 y.o. MRN: [id number]  Patient Care Team: Bennett Reuben POUR, MD as PCP - General (Family Medicine)   CC:  Chief Complaint  Patient presents with   Annual Exam     Stuart Green is a 38 y.o. male who presents today for a complete physical exam.  Diet: Daily soda, mostly 2-3 mountain dew a day Exercise: Not doing Mood: No complaints Sleep: No complaints  Depression Screening;    12/29/2023    9:35 AM 11/30/2023   10:57 AM 09/01/2023    1:29 PM  PHQ 2/9 Scores  PHQ - 2 Score 0 0 0  PHQ- 9 Score  0 1     Anxiety Screening:    11/30/2023   10:57 AM 09/01/2023    1:30 PM  GAD 7 : Generalized Anxiety Score  Nervous, Anxious, on Edge 0 0  Control/stop worrying 0 1  Worry too much - different  things 0 1  Trouble relaxing 0 0  Restless 0 1  Easily annoyed or irritable 0 1  Afraid - awful might happen 0 0  Total GAD 7 Score 0 4  Anxiety Difficulty  Somewhat difficult     ROS: Negative unless specifically indicated above in HPI.       Objective:     BP 114/78 (BP Location: Left Arm, Patient Position: Sitting, Cuff Size: Large)   Pulse 70   Temp 98.5 F (36.9 C) (Oral)   Ht 5' 9 (1.753 m)   Wt 219 lb (99.3 kg)   SpO2 96%   BMI 32.34 kg/m    Physical Exam Vitals and nursing note reviewed.  Constitutional:      Appearance: Normal appearance.  HENT:     Head: Normocephalic and atraumatic.  Eyes:     Extraocular Movements: Extraocular movements intact.     Conjunctiva/sclera: Conjunctivae normal.  Skin:    General: Skin is warm.  Neurological:     Mental Status: He is alert.  Psychiatric:        Mood and Affect: Mood normal.        Behavior: Behavior normal.       Reuben POUR Bennett, MD  12/29/23

## 2023-12-29 NOTE — Addendum Note (Signed)
 Addended by: KALLIE CLOTILDA SQUIBB on: 12/29/2023 10:29 AM   Modules accepted: Orders

## 2023-12-30 LAB — HEPATITIS B SURFACE ANTIBODY,QUALITATIVE: Hep B S Ab: NONREACTIVE

## 2023-12-30 LAB — HEPATITIS PANEL, ACUTE
Hep A IgM: NONREACTIVE
Hep B C IgM: NONREACTIVE
Hepatitis B Surface Ag: NONREACTIVE
Hepatitis C Ab: NONREACTIVE

## 2024-01-01 ENCOUNTER — Ambulatory Visit: Payer: Self-pay

## 2024-01-11 ENCOUNTER — Encounter: Payer: Self-pay | Admitting: Surgery

## 2024-01-11 ENCOUNTER — Ambulatory Visit (INDEPENDENT_AMBULATORY_CARE_PROVIDER_SITE_OTHER): Admitting: Surgery

## 2024-01-11 VITALS — BP 130/88 | HR 83 | Temp 98.1°F | Resp 12 | Ht 69.0 in | Wt 217.0 lb

## 2024-01-11 DIAGNOSIS — K648 Other hemorrhoids: Secondary | ICD-10-CM | POA: Diagnosis not present

## 2024-01-11 DIAGNOSIS — K644 Residual hemorrhoidal skin tags: Secondary | ICD-10-CM | POA: Diagnosis not present

## 2024-01-11 MED ORDER — HYDROCORTISONE ACETATE 25 MG RE SUPP: 25.0000 mg | Freq: Two times a day (BID) | RECTAL | 0 refills | Status: AC

## 2024-01-11 NOTE — Patient Instructions (Signed)
 IF Hemorrhoids become painful:  - Use Americaine ointment as needed for pain.  May apply over hemorrhoid and anus - Continue to use over the counter suppositories - Take Sitz baths (shallow warm water baths) for comfort and after bowel movements.  - Use wet wipes after bowel movements - Please keep your stools soft and take fiber daily (metamucil) and colace (over the counter) to help prevent constipation.

## 2024-01-11 NOTE — Progress Notes (Signed)
 [facility] History and Physical  Reason for Referral: Hemorrhoids Referring Physician: Dr. Bennett  Chief Complaint   New Patient (Initial Visit)     Stuart Green is a 38 y.o. male.  HPI: Patient presents for evaluation of hemorrhoids.  For the last 7 years, he has had bleeding with bowel movements.  It seems to happen 1-2 times per week.  It is also more frequently happening on days where he has numerous bowel movements.  He denies any issues with constipation and denies straining with bowel movements.  Sometimes the blood is bright red and other times it appears darker.  He denies any family history of colorectal cancer.  He has never undergone a colon anoscopy.  He has not tried any treatments for his hemorrhoids.  His past medical history is significant for asthma.  He denies use of blood thinning medications.  He denies history of any kind of surgeries.  He smokes 5 black and milds a day for 4 days during the week while at work.  He will drink alcohol on the weekends and he smokes marijuana daily.  Past Medical History:  Diagnosis Date   Asthma    In childhood, but resolved    No past surgical history on file.  Family History  Problem Relation Age of Onset   Cancer Paternal Grandmother     Social History   Tobacco Use   Smoking status: Every Day    Types: Cigars   Smokeless tobacco: Never  Vaping Use   Vaping status: Never Used  Substance Use Topics   Alcohol use: Yes   Drug use: Not Currently    Medications: I have reviewed the patient's current medications. Allergies as of 01/11/2024       Reactions   Dust Mite Extract Other (See Comments)   sneezing        Medication List        Accurate as of January 11, 2024  1:28 PM. If you have any questions, ask your nurse or doctor.          hydrocortisone 25 MG suppository Commonly known as: Anusol-HC Place 1 suppository (25 mg total) rectally 2 (two) times daily for 7 days. Started by:  Stuart Green         ROS:  Constitutional: negative for chills, fatigue, and fevers Eyes: negative for visual disturbance and pain Ears, nose, mouth, throat, and face: negative for ear drainage, sore throat, and sinus problems Respiratory: negative for cough, wheezing, and shortness of breath Cardiovascular: negative for chest pain and palpitations Gastrointestinal: negative for abdominal pain, nausea, reflux symptoms, and vomiting Genitourinary:negative for dysuria and frequency Integument/breast: negative for dryness and rash Hematologic/lymphatic: negative for bleeding and lymphadenopathy Musculoskeletal:negative for back pain and neck pain Neurological: negative for dizziness and tremors Endocrine: negative for temperature intolerance  Blood pressure 130/88, pulse 83, temperature 98.1 F (36.7 C), temperature source Oral, resp. rate 12, height 5' 9 (1.753 m), weight 217 lb (98.4 kg), SpO2 94%. Physical Exam Vitals reviewed.  Constitutional:      Appearance: Normal appearance.  HENT:     Head: Normocephalic and atraumatic.  Eyes:     Extraocular Movements: Extraocular movements intact.     Pupils: Pupils are equal, round, and reactive to light.  Cardiovascular:     Rate and Rhythm: Normal rate and regular rhythm.  Pulmonary:     Effort: Pulmonary effort is normal.     Breath sounds: Normal breath sounds.  Abdominal:  Comments: Abdomen soft, nondistended, no percussion tenderness, nontender to palpation; no rigidity, guarding, rebound tenderness  Genitourinary:    Comments: With the patient in a left lateral decubitus position, he has an external skin tag/hemorrhoid at the 6 o'clock position, no anal fissure visible, palpable internal hemorrhoids on digital rectal exam without gross blood noted Musculoskeletal:        General: Normal range of motion.     Cervical back: Normal range of motion.  Skin:    General: Skin is warm and dry.  Neurological:      General: No focal deficit present.     Mental Status: He is alert and oriented to person, place, and time.  Psychiatric:        Mood and Affect: Mood normal.        Behavior: Behavior normal.     Results: No results found for this or any previous visit (from the past 48 hours).  No results found.   Assessment & Plan:  Stuart Green is a 38 y.o. male who presents for evaluation of hemorrhoids.  -Hemorrhoid surgery for external hemorrhoids is very painful. The pain and discomfort that the patient is having currently will be magnified after the surgery for at least 2-3 weeks.  The patient will have feelings of constant pressure and pain in the area from the swelling and removal of the anoderm (skin around the anus). The internal hemorrhoids are not painful to remove because the same nerves are not involved, and the sensation is different, but removal of any external hemorrhoids will cause significant discomfort. They will need at least 4-6 weeks to recover from the surgery, and should not expect to be able to feel back to normal for 6-8 weeks.   -Given that the patient is not currently having any pain, he would like to hold off on any surgical interventions at this time. -He was supposed to follow-up with GI, however this appointment was never made.   -We discussed that he can follow-up with them for both colonoscopy evaluation and to discuss internal hemorrhoid banding, since his only symptom currently is bleeding.   -We did discuss that if the internal hemorrhoids are large, he may not be a candidate for banding.  In that case, I would recommend conservative treatments with surgical intervention if he fails conservative treatments -Prescription sent in for Anusol suppositories for a week -Follow-up with me as needed  All questions were answered to the satisfaction of the patient.  Note: Portions of this report may have been transcribed using voice recognition software. Every effort has  been made to ensure accuracy; however, inadvertent computerized transcription errors may still be present.   Dorothyann Brittle, DO [facility] [address] [place], [place] [address] [phone] (office)

## 2024-01-24 NOTE — Progress Notes (Unsigned)
 Stuart Green [id number] 10/02/1985   Chief Complaint:  Referring Provider: Bennett Reuben POUR, MD Primary GI MD: Sampson  HPI: Stuart Green is a 38 y.o. male with past medical history of asthma, HLD, prediabetes who presents today for a complaint of rectal bleeding and hemorrhoids.    Seen by PCP 11/30/2023 to establish care.  At that time endorsed blood in his stool for the last 7 years, large amounts of blood, not occurring with every bowel movement but about once every 1 to 2 weeks.  Dark red blood, soft stools, anal irritation sometimes making him unable to sit down.  Exacerbated by curry, coffee.  Noted to have an anal fissure and external hemorrhoid on exam.  No prior colonoscopy.  No family history of colon cancer. Referred to GI for further evaluation and consideration of colonoscopy. Referred to colorectal surgery to discuss possible hemorrhoid removal surgery at patient request.  No anemia on recent labs.  Seen in consult by general surgery 01/11/2024.  Conservative management of hemorrhoids was advised.  Patient also advised to discuss potential hemorrhoid banding for internal hemorrhoids at GI appointment.  Prescription was sent in for Anusol suppositories.  Discussed the use of AI scribe software for clinical note transcription with the patient, who gave verbal consent to proceed.  History of Present Illness       Previous GI Procedures/Imaging      Past Medical History:  Diagnosis Date   Asthma    In childhood, but resolved    No past surgical history on file.  No current outpatient medications on file.   No current facility-administered medications for this visit.    Allergies as of 01/25/2024 - Review Complete 01/11/2024  Allergen Reaction Noted   Dust mite extract Other (See Comments) 09/01/2023    Family History  Problem Relation Age of Onset   Cancer Paternal Grandmother     Social History   Tobacco Use   Smoking status: Every Day     Types: Cigars   Smokeless tobacco: Never  Vaping Use   Vaping status: Never Used  Substance Use Topics   Alcohol use: Yes   Drug use: Not Currently     Review of Systems:    Constitutional: No weight loss, fever, chills, weakness or fatigue Eyes: No change in vision Ears, Nose, Throat:  No change in hearing or congestion Skin: No rash or itching Cardiovascular: No chest pain, chest pressure or palpitations   Respiratory: No SOB or cough Gastrointestinal: See HPI and otherwise negative Genitourinary: No dysuria or change in urinary frequency Neurological: No headache, dizziness or syncope Musculoskeletal: No new muscle or joint pain Hematologic: No bleeding or bruising    Physical Exam:  Vital signs: There were no vitals taken for this visit.  Constitutional: NAD, Well developed, Well nourished, alert and cooperative Head:  Normocephalic and atraumatic.  Eyes: No scleral icterus. Conjunctiva pink. Mouth: No oral lesions. Respiratory: Respirations even and unlabored. Lungs clear to auscultation bilaterally.  No wheezes, crackles, or rhonchi.  Cardiovascular:  Regular rate and rhythm. No murmurs. No peripheral edema. Gastrointestinal:  Soft, nondistended, nontender. No rebound or guarding. Normal bowel sounds. No appreciable masses or hepatomegaly. Rectal:  Not performed.  Neurologic:  Alert and oriented x4;  grossly normal neurologically.  Skin:   Dry and intact without significant lesions or rashes. Psychiatric: Oriented to person, place and time. Demonstrates good judgement and reason without abnormal affect or behaviors.   RELEVANT LABS AND IMAGING: CBC    Component  Value Date/Time   WBC 6.5 12/07/2023 1036   RBC 5.16 12/07/2023 1036   HGB 14.5 12/07/2023 1036   HCT 44.7 12/07/2023 1036   PLT 165.0 12/07/2023 1036   MCV 86.7 12/07/2023 1036   MCH 28.1 09/14/2019 1027   MCHC 32.3 12/07/2023 1036   RDW 14.2 12/07/2023 1036   LYMPHSABS 2.7 12/07/2023 1036   MONOABS  0.8 12/07/2023 1036   EOSABS 0.2 12/07/2023 1036   BASOSABS 0.0 12/07/2023 1036    CMP     Component Value Date/Time   NA 136 09/14/2019 1027   K 4.0 09/14/2019 1027   CL 103 09/14/2019 1027   CO2 23 09/14/2019 1027   GLUCOSE 101 (H) 09/14/2019 1027   BUN 7 09/14/2019 1027   CREATININE 0.83 09/14/2019 1027   CALCIUM 9.1 09/14/2019 1027   PROT 7.2 09/14/2019 1027   ALBUMIN 4.0 09/14/2019 1027   AST 32 09/14/2019 1027   ALT 49 (H) 09/14/2019 1027   ALKPHOS 58 09/14/2019 1027   BILITOT 0.7 09/14/2019 1027   GFRNONAA >60 09/14/2019 1027   GFRAA >60 09/14/2019 1027     Assessment/Plan:    Assessment and Plan Assessment & Plan        Camie Furbish, PA-C [facility] 01/24/2024, 5:04 PM  Patient Care Team: Bennett Reuben POUR, MD as PCP - General (Family Medicine)

## 2024-01-25 ENCOUNTER — Encounter: Payer: Self-pay | Admitting: Gastroenterology

## 2024-01-25 ENCOUNTER — Ambulatory Visit: Admitting: Gastroenterology

## 2024-01-25 VITALS — BP 130/76 | HR 74 | Ht 70.5 in | Wt 218.1 lb

## 2024-01-25 DIAGNOSIS — K649 Unspecified hemorrhoids: Secondary | ICD-10-CM

## 2024-01-25 DIAGNOSIS — K625 Hemorrhage of anus and rectum: Secondary | ICD-10-CM

## 2024-01-25 MED ORDER — NA SULFATE-K SULFATE-MG SULF 17.5-3.13-1.6 GM/177ML PO SOLN: 1.0000 | ORAL | 0 refills | Status: DC

## 2024-01-25 MED ORDER — HYDROCORTISONE ACETATE 25 MG RE SUPP: 25.0000 mg | Freq: Every day | RECTAL | 0 refills | Status: DC

## 2024-01-25 NOTE — Patient Instructions (Signed)
 We have sent the following medications to your pharmacy for you to pick up at your convenience: Suprep, Anusol Suppositories   You have been scheduled for a colonoscopy. Please follow written instructions given to you at your visit today.   If you use inhalers (even only as needed), please bring them with you on the day of your procedure.  DO NOT TAKE 7 DAYS PRIOR TO TEST- Trulicity (dulaglutide) Ozempic, Wegovy (semaglutide) Mounjaro, Zepbound (tirzepatide) Bydureon Bcise (exanatide extended release)  DO NOT TAKE 1 DAY PRIOR TO YOUR TEST Rybelsus (semaglutide) Adlyxin (lixisenatide) Victoza (liraglutide) Byetta (exanatide) ____________________________________________________________   Due to recent changes in healthcare laws, you may see the results of your imaging and laboratory studies on MyChart before your provider has had a chance to review them.  We understand that in some cases there may be results that are confusing or concerning to you. Not all laboratory results come back in the same time frame and the provider may be waiting for multiple results in order to interpret others.  Please give us  48 hours in order for your provider to thoroughly review all the results before contacting the office for clarification of your results.   Thank you for choosing me and [facility].  Camie Furbish, PA-C

## 2024-01-27 NOTE — Progress Notes (Signed)
 ____________________________________________________________  Attending physician addendum:  Thank you for sending this case to me. I have reviewed the entire note and agree with the plan.  Agree with colonoscopy for further evaluation, and then we can assess whether these hemorrhoids may be amenable to banding. If not, then surgical reevaluation would be warranted.  Victory Brand, MD  ____________________________________________________________

## 2024-03-01 ENCOUNTER — Encounter: Payer: Self-pay | Admitting: Gastroenterology

## 2024-03-07 ENCOUNTER — Encounter: Admitting: Gastroenterology

## 2024-03-08 ENCOUNTER — Encounter: Payer: Self-pay | Admitting: Gastroenterology

## 2024-03-08 ENCOUNTER — Ambulatory Visit: Admitting: Gastroenterology

## 2024-03-08 VITALS — BP 143/85 | HR 56 | Temp 97.8°F | Resp 14 | Ht 70.0 in | Wt 218.0 lb

## 2024-03-08 DIAGNOSIS — K625 Hemorrhage of anus and rectum: Secondary | ICD-10-CM

## 2024-03-08 DIAGNOSIS — K5730 Diverticulosis of large intestine without perforation or abscess without bleeding: Secondary | ICD-10-CM | POA: Diagnosis present

## 2024-03-08 DIAGNOSIS — K648 Other hemorrhoids: Secondary | ICD-10-CM

## 2024-03-08 MED ORDER — SODIUM CHLORIDE 0.9 % IV SOLN: 500.0000 mL | INTRAVENOUS | Status: DC

## 2024-03-08 NOTE — Progress Notes (Signed)
 Sedate, gd SR, tolerated procedure well, VSS, report to RN

## 2024-03-08 NOTE — Op Note (Signed)
 [facility] Patient Name: Stuart Green Procedure Date: 03/08/2024 11:53 AM MRN: [id number] Endoscopist: Victory L. Legrand , MD, [id number] Age: 38 Referring MD:  Date of Birth: 1986-01-30 Gender: Male Account #: [id number] Procedure:                Colonoscopy Indications:              Rectal bleeding Medicines:                Monitored Anesthesia Care Procedure:                Pre-Anesthesia Assessment:                           - Prior to the procedure, a History and Physical                            was performed, and patient medications and                            allergies were reviewed. The patient's tolerance of                            previous anesthesia was also reviewed. The risks                            and benefits of the procedure and the sedation                            options and risks were discussed with the patient.                            All questions were answered, and informed consent                            was obtained. Prior Anticoagulants: The patient has                            taken no anticoagulant or antiplatelet agents. ASA                            Grade Assessment: II - A patient with mild systemic                            disease. After reviewing the risks and benefits,                            the patient was deemed in satisfactory condition to                            undergo the procedure.                           After obtaining informed consent, the colonoscope  was passed under direct vision. Throughout the                            procedure, the patient's blood pressure, pulse, and                            oxygen saturations were monitored continuously. The                            CF [id number] #[id number] was introduced through the anus                            and advanced to the the cecum, identified by                            appendiceal orifice and ileocecal valve. The                             colonoscopy was performed without difficulty. The                            patient tolerated the procedure well. The quality                            of the bowel preparation was excellent. The                            ileocecal valve, appendiceal orifice, and rectum                            were photographed. Scope In: 12:02:34 PM Scope Out: 12:14:59 PM Scope Withdrawal Time: 0 hours 9 minutes 11 seconds  Total Procedure Duration: 0 hours 12 minutes 25 seconds  Findings:                 The perianal and digital rectal examinations were                            normal.                           Repeat examination of right colon under NBI                            performed.                           Multiple small-mouthed diverticula were found in                            the entire colon.                           Internal hemorrhoids were found. More prominent  with some prolapse at end of procedure, primarily                            the left lateral column.                           The exam was otherwise without abnormality on                            direct and retroflexion views. Complications:            No immediate complications. Estimated Blood Loss:     Estimated blood loss: none. Impression:               - Diverticulosis in the entire examined colon.                           - Internal hemorrhoids.                           - The examination was otherwise normal on direct                            and retroflexion views.                           - No specimens collected. Recommendation:           - Patient has a contact number available for                            emergencies. The signs and symptoms of potential                            delayed complications were discussed with the                            patient. Return to normal activities tomorrow.                            Written  discharge instructions were provided to the                            patient.                           - Resume previous diet.                           - Continue present medications.                           - Repeat colonoscopy in 10 years for screening                            purposes.                           -  Return to my office for hemorrhoidal banding. Ifeoluwa Bartz L. Legrand, MD 03/08/2024 12:23:56 PM This report has been signed electronically.

## 2024-03-08 NOTE — Progress Notes (Signed)
 History and Physical:  This patient presents for endoscopic testing for: Encounter Diagnosis  Name Primary?   Rectal bleeding Yes    38 year old man here today for colonoscopy to evaluate rectal bleeding, with clinical details outlined in the 01/25/2024 APP office note. No significant clinical changes since then.  Patient is otherwise without complaints or active issues today.   Past Medical History: Past Medical History:  Diagnosis Date   Asthma    In childhood, but resolved   Hemorrhoids    Rectal bleeding      Past Surgical History: History reviewed. No pertinent surgical history.  Allergies: Allergies[1]  Outpatient Meds: Current Outpatient Medications  Medication Sig Dispense Refill   Na Sulfate-K Sulfate-Mg Sulfate concentrate (SUPREP BOWEL PREP KIT) 17.5-3.13-1.6 GM/177ML SOLN Take 1 kit (354 mLs total) by mouth as directed. For colonoscopy prep 354 mL 0   hydrocortisone  (ANUSOL -HC) 25 MG suppository Place 1 suppository (25 mg total) rectally at bedtime. (Patient not taking: Reported on 03/08/2024) 12 suppository 0   Current Facility-Administered Medications  Medication Dose Route Frequency Provider Last Rate Last Admin   0.9 %  sodium chloride  infusion  500 mL Intravenous Continuous Danis, Victory CROME III, MD          ___________________________________________________________________ Objective   Exam:  BP (!) 161/101   Pulse 60   Temp 97.8 F (36.6 C) (Temporal)   Ht 5' 10 (1.778 m)   Wt 218 lb (98.9 kg)   SpO2 100%   BMI 31.28 kg/m   CV: regular , S1/S2 Resp: clear to auscultation bilaterally, normal RR and effort noted GI: soft, no tenderness, with active bowel sounds.   Assessment: Encounter Diagnosis  Name Primary?   Rectal bleeding Yes     Plan: Colonoscopy   The benefits and risks of the planned procedure(s) were described in detail with the patient or (when appropriate) their health care proxy.  Risks were outlined as including, but  not limited to, bleeding, infection, perforation, adverse medication reaction leading to cardiac or pulmonary decompensation, pancreatitis (if ERCP).  The limitation of incomplete mucosal visualization was also discussed.  No guarantees or warranties were given.  The patient was provided an opportunity to ask questions and all were answered. The patient agreed with the plan.   The patient is appropriate for an endoscopic procedure in the ambulatory setting.   - Victory Brand, MD        [1]  Allergies Allergen Reactions   Dust Mite Extract Other (See Comments)    sneezing

## 2024-03-08 NOTE — Patient Instructions (Signed)
 Resume previous diet and medications. Repeat Colonoscopy in 10 years for surveillance. Return to office for Hemorrhoidal Banding. Handouts provided on Diverticulosis and Hemorrhoids  YOU HAD AN ENDOSCOPIC PROCEDURE TODAY AT THE [facility]:   Refer to the procedure report that was given to you for any specific questions about what was found during the examination.  If the procedure report does not answer your questions, please call your gastroenterologist to clarify.  If you requested that your care partner not be given the details of your procedure findings, then the procedure report has been included in a sealed envelope for you to review at your convenience later.  YOU SHOULD EXPECT: Some feelings of bloating in the abdomen. Passage of more gas than usual.  Walking can help get rid of the air that was put into your GI tract during the procedure and reduce the bloating. If you had a lower endoscopy (such as a colonoscopy or flexible sigmoidoscopy) you may notice spotting of blood in your stool or on the toilet paper. If you underwent a bowel prep for your procedure, you may not have a normal bowel movement for a few days.  Please Note:  You might notice some irritation and congestion in your nose or some drainage.  This is from the oxygen used during your procedure.  There is no need for concern and it should clear up in a day or so.  SYMPTOMS TO REPORT IMMEDIATELY:  Following lower endoscopy (colonoscopy or flexible sigmoidoscopy):  Excessive amounts of blood in the stool  Significant tenderness or worsening of abdominal pains  Swelling of the abdomen that is new, acute  Fever of 100F or higher  For urgent or emergent issues, a gastroenterologist can be reached at any hour by calling [phone]. Do not use MyChart messaging for urgent concerns.    DIET:  We do recommend a small meal at first, but then you may proceed to your regular diet.  Drink plenty of fluids but you  should avoid alcoholic beverages for 24 hours.  ACTIVITY:  You should plan to take it easy for the rest of today and you should NOT DRIVE or use heavy machinery until tomorrow (because of the sedation medicines used during the test).    FOLLOW UP: Our staff will call the number listed on your records the next business day following your procedure.  We will call around 7:15- 8:00 am to check on you and address any questions or concerns that you may have regarding the information given to you following your procedure. If we do not reach you, we will leave a message.     If any biopsies were taken you will be contacted by phone or by letter within the next 1-3 weeks.  Please call us  at [phone] if you have not heard about the biopsies in 3 weeks.    SIGNATURES/CONFIDENTIALITY: You and/or your care partner have signed paperwork which will be entered into your electronic medical record.  These signatures attest to the fact that that the information above on your After Visit Summary has been reviewed and is understood.  Full responsibility of the confidentiality of this discharge information lies with you and/or your care-partner.

## 2024-03-11 ENCOUNTER — Telehealth: Payer: Self-pay

## 2024-03-11 NOTE — Telephone Encounter (Signed)
 No answer on follow-up call. Left VM for pt.

## 2024-04-04 ENCOUNTER — Ambulatory Visit

## 2024-04-14 DIAGNOSIS — K648 Other hemorrhoids: Secondary | ICD-10-CM | POA: Insufficient documentation

## 2024-04-17 ENCOUNTER — Encounter: Admitting: Gastroenterology

## 2024-04-19 ENCOUNTER — Ambulatory Visit: Admitting: Gastroenterology

## 2024-04-19 ENCOUNTER — Encounter: Payer: Self-pay | Admitting: Gastroenterology

## 2024-04-19 VITALS — BP 122/74 | HR 81 | Ht 70.0 in | Wt 214.0 lb

## 2024-04-19 DIAGNOSIS — K648 Other hemorrhoids: Secondary | ICD-10-CM

## 2024-04-19 DIAGNOSIS — K641 Second degree hemorrhoids: Secondary | ICD-10-CM

## 2024-04-19 NOTE — Patient Instructions (Signed)

## 2024-04-19 NOTE — Progress Notes (Signed)
 PROCEDURE NOTE: The patient presents with symptomatic grade 2  hemorrhoids, requesting rubber band ligation of his/her hemorrhoidal disease.  All risks, benefits and alternative forms of therapy were described and informed consent was obtained.  DRE revealed: Sensitive to rectal exam, even with no fissure present   The anorectum was pre-medicated with 0.125% NTG and lubricant. The decision was made to band the LL internal hemorrhoids, and the [facility] ORegan System was used to perform band ligation without complication.  Digital anorectal examination was then performed to assure proper positioning of the band, and to adjust the banded tissue as required.  The patient was discharged home without pain or other issues.  Dietary and behavioral recommendations were given and along with follow-up instructions.     The following adjunctive treatments were recommended:  None  The patient will return as needed for  follow-up and possible additional banding as required. No complications were encountered and the patient tolerated the procedure well.  He did not tolerate either the rectal exam or the banding well, and was quite tense with heightened sphincter tone. He also did vagal episode with lightheadedness and sweating for several minutes afterwards but passed after he laid down and rested.  I banded what was the most prominent, colonoscopy and was also evident on just perianal exam, so hopefully that will control his symptoms and he may not need further banding.  He will let us  know how he feels in the coming weeks before scheduling another banding  - Victory Brand, MD
